# Patient Record
Sex: Male | Born: 1980 | Race: Black or African American | Hispanic: No | Marital: Single | Smoking: Never smoker
Health system: Southern US, Community
[De-identification: ages and names within clinical notes are randomized; demographics above are authoritative.]

## PROBLEM LIST (undated history)

## (undated) DIAGNOSIS — N289 Disorder of kidney and ureter, unspecified: Secondary | ICD-10-CM

## (undated) HISTORY — PX: KIDNEY SURGERY: SHX687

---
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
## 2003-09-21 ENCOUNTER — Emergency Department (HOSPITAL_COMMUNITY): Admission: EM | Admit: 2003-09-21 | Discharge: 2003-09-21 | Payer: Self-pay | Admitting: Emergency Medicine

## 2003-11-17 ENCOUNTER — Emergency Department (HOSPITAL_COMMUNITY): Admission: EM | Admit: 2003-11-17 | Discharge: 2003-11-17 | Payer: Self-pay | Admitting: Emergency Medicine

## 2008-08-25 ENCOUNTER — Emergency Department (HOSPITAL_BASED_OUTPATIENT_CLINIC_OR_DEPARTMENT_OTHER): Admission: EM | Admit: 2008-08-25 | Discharge: 2008-08-25 | Payer: Self-pay | Admitting: Emergency Medicine

## 2010-05-08 LAB — URINALYSIS, ROUTINE W REFLEX MICROSCOPIC
Bilirubin Urine: NEGATIVE
Glucose, UA: NEGATIVE mg/dL
Hgb urine dipstick: NEGATIVE
Ketones, ur: NEGATIVE mg/dL
Nitrite: NEGATIVE
Protein, ur: NEGATIVE mg/dL
Specific Gravity, Urine: 1.016 (ref 1.005–1.030)
Urobilinogen, UA: 1 mg/dL (ref 0.0–1.0)
pH: 7.5 (ref 5.0–8.0)

## 2010-05-08 LAB — URINE CULTURE
Colony Count: NO GROWTH
Culture: NO GROWTH

## 2011-02-24 ENCOUNTER — Other Ambulatory Visit: Payer: Self-pay | Admitting: Oncology

## 2011-09-29 ENCOUNTER — Telehealth: Payer: Self-pay | Admitting: *Deleted

## 2011-09-29 NOTE — Telephone Encounter (Signed)
Opened in error

## 2011-10-03 ENCOUNTER — Telehealth: Payer: Self-pay | Admitting: Oncology

## 2011-10-03 NOTE — Telephone Encounter (Signed)
Chart opened in error

## 2013-03-30 ENCOUNTER — Encounter (HOSPITAL_COMMUNITY): Payer: Self-pay | Admitting: Emergency Medicine

## 2013-03-30 ENCOUNTER — Emergency Department (HOSPITAL_COMMUNITY)
Admission: EM | Admit: 2013-03-30 | Discharge: 2013-03-30 | Disposition: A | Discharge status: Home / Self Care | Payer: Self-pay | Attending: Emergency Medicine | Admitting: Emergency Medicine

## 2013-03-30 DIAGNOSIS — R319 Hematuria, unspecified: Secondary | ICD-10-CM | POA: Insufficient documentation

## 2013-03-30 DIAGNOSIS — N23 Unspecified renal colic: Secondary | ICD-10-CM | POA: Insufficient documentation

## 2013-03-30 DIAGNOSIS — Z87442 Personal history of urinary calculi: Secondary | ICD-10-CM | POA: Insufficient documentation

## 2013-03-30 DIAGNOSIS — Z791 Long term (current) use of non-steroidal anti-inflammatories (NSAID): Secondary | ICD-10-CM | POA: Insufficient documentation

## 2013-03-30 HISTORY — DX: Disorder of kidney and ureter, unspecified: N28.9

## 2013-03-30 LAB — URINALYSIS, ROUTINE W REFLEX MICROSCOPIC
Bilirubin Urine: NEGATIVE
Glucose, UA: NEGATIVE mg/dL
Ketones, ur: NEGATIVE mg/dL
Nitrite: NEGATIVE
Protein, ur: NEGATIVE mg/dL
Specific Gravity, Urine: 1.022 (ref 1.005–1.030)
Urobilinogen, UA: 1 mg/dL (ref 0.0–1.0)
pH: 7 (ref 5.0–8.0)

## 2013-03-30 LAB — BASIC METABOLIC PANEL
BUN: 7 mg/dL (ref 6–23)
CO2: 28 mEq/L (ref 19–32)
Calcium: 9.5 mg/dL (ref 8.4–10.5)
Chloride: 102 mEq/L (ref 96–112)
Creatinine, Ser: 0.88 mg/dL (ref 0.50–1.35)
GFR calc Af Amer: >90 mL/min (ref >90)
GFR calc non Af Amer: >90 mL/min (ref >90)
Glucose, Bld: 86 mg/dL (ref 70–99)
Potassium: 4.2 mEq/L (ref 3.7–5.3)
Sodium: 141 mEq/L (ref 137–147)

## 2013-03-30 LAB — URINE MICROSCOPIC-ADD ON

## 2013-03-30 MED ORDER — KETOROLAC TROMETHAMINE 60 MG/2ML IM SOLN
60.0000 mg | Freq: Once | INTRAMUSCULAR | Status: AC
  Administered 2013-03-30: 60 mg via INTRAMUSCULAR
  Filled 2013-03-30: qty 2

## 2013-03-30 MED ORDER — HYDROCODONE-ACETAMINOPHEN 5-325 MG PO TABS: 1.0000 | ORAL_TABLET | ORAL | Status: DC | PRN

## 2013-03-30 MED ORDER — KETOROLAC TROMETHAMINE 30 MG/ML IJ SOLN: 30.0000 mg | Freq: Once | INTRAMUSCULAR | Status: DC

## 2013-03-30 MED ORDER — HYDROMORPHONE HCL PF 1 MG/ML IJ SOLN: 1.0000 mg | Freq: Once | INTRAMUSCULAR | Status: DC

## 2013-03-30 NOTE — Discharge Instructions (Signed)
Ureteral Colic (Kidney Stones) °Ureteral colic is the result of a condition when kidney stones form inside the kidney. Once kidney stones are formed they may move into the tube that connects the kidney with the bladder (ureter). If this occurs, this condition may cause pain (colic) in the ureter.  °CAUSES  °Pain is caused by stone movement in the ureter and the obstruction caused by the stone. °SYMPTOMS  °The pain comes and goes as the ureter contracts around the stone. The pain is usually intense, sharp, and stabbing in character. The location of the pain may move as the stone moves through the ureter. When the stone is near the kidney the pain is usually located in the back and radiates to the belly (abdomen). When the stone is ready to pass into the bladder the pain is often located in the lower abdomen on the side the stone is located. At this location, the symptoms may mimic those of a urinary tract infection with urinary frequency. Once the stone is located here it often passes into the bladder and the pain disappears completely. °TREATMENT  °· Your caregiver will provide you with medicine for pain relief. °· You may require specialized follow-up X-rays. °· The absence of pain does not always mean that the stone has passed. It may have just stopped moving. If the urine remains completely obstructed, it can cause loss of kidney function or even complete destruction of the involved kidney. It is your responsibility and in your interest that X-rays and follow-ups as suggested by your caregiver are completed. Relief of pain without passage of the stone can be associated with severe damage to the kidney, including loss of kidney function on that side. °· If your stone does not pass on its own, additional measures may be taken by your caregiver to ensure its removal. °HOME CARE INSTRUCTIONS  °· Increase your fluid intake. Water is the preferred fluid since juices containing vitamin C may acidify the urine making it  less likely for certain stones (uric acid stones) to pass. °· Strain all urine. A strainer will be provided. Keep all particulate matter or stones for your caregiver to inspect. °· Take your pain medicine as directed. °· Make a follow-up appointment with your caregiver as directed. °· Remember that the goal is passage of your stone. The absence of pain does not mean the stone is gone. Follow your caregiver's instructions. °· Only take over-the-counter or prescription medicines for pain, discomfort, or fever as directed by your caregiver. °SEEK MEDICAL CARE IF:  °· Pain cannot be controlled with the prescribed medicine. °· You have a fever. °· Pain continues for longer than your caregiver advises it should. °· There is a change in the pain, and you develop chest discomfort or constant abdominal pain. °· You feel faint or pass out. °MAKE SURE YOU:  °· Understand these instructions. °· Will watch your condition. °· Will get help right away if you are not doing well or get worse. °Document Released: 10/26/2004 Document Revised: 05/13/2012 Document Reviewed: 07/13/2010 °[employer]® Patient Information ©2014 [employer]. ° °

## 2013-03-30 NOTE — ED Notes (Signed)
Pt from home c/o L flank pain x3 days. Pt denies dysuria, frequency, N/V. Pt has hx of kidney stones. Pt is A&O and in NAD

## 2013-03-30 NOTE — ED Provider Notes (Signed)
CSN: [id number]     Arrival date & time 03/30/13  1120 History   First MD Initiated Contact with Patient 03/30/13 1132     Chief Complaint  Patient presents with  . Flank Pain      HPI Patient reports a history of ureteral stones.  States over the past several days he's had some left flank pain with radiation towards his left groin.  His pain became more severe this morning and then just prior to arrival of emergency department his pain seemed to significantly improve.  He states his pain is just a dull ache now.  No dysuria or urinary frequency.  He has a history of kidney stones before with CT evidence of stone several years ago in the emergency department.  Films are available.  He denies nausea and vomiting.  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 Renal disorder     renal stones   Past Surgical History  Procedure Laterality Date  . Kidney surgery Left     reconstruction    No family history on file. History  Substance Use Topics  . Smoking status: Never Smoker   . Smokeless tobacco: Not on file  . Alcohol Use: Yes    Review of Systems  All other systems reviewed and are negative.      Allergies  Review of patient's allergies indicates no known allergies.  Home Medications   Current Outpatient Rx  Name  Route  Sig  Dispense  Refill  . ibuprofen (ADVIL,MOTRIN) 200 MG tablet   Oral   Take 200 mg by mouth once.         . naproxen sodium (ANAPROX) 220 MG tablet   Oral   Take 660 mg by mouth once.         Marland Kitchen. HYDROcodone-acetaminophen (NORCO/VICODIN) 5-325 MG per tablet   Oral   Take 1 tablet by mouth every 4 (four) hours as needed for moderate pain.   12 tablet   0    BP 122/97  Pulse 55  Resp 17  SpO2 97% Physical Exam  Nursing note and vitals reviewed. Constitutional: He is oriented to person, place, and time. He appears well-developed and well-nourished.  HENT:   Head: Normocephalic and atraumatic.  Eyes: EOM are normal.  Neck: Normal range of motion.  Cardiovascular: Normal rate, regular rhythm, normal heart sounds and intact distal pulses.   Pulmonary/Chest: Effort normal and breath sounds normal. No respiratory distress.  Abdominal: Soft. He exhibits no distension. There is no tenderness.  Musculoskeletal: Normal range of motion.  Neurological: He is alert and oriented to person, place, and time.  Skin: Skin is warm and dry.  Psychiatric: He has a normal mood and affect. Judgment normal.    ED Course  Procedures (including critical care time) Labs Review Labs Reviewed  URINALYSIS, ROUTINE W REFLEX MICROSCOPIC - Abnormal; Notable for the following:    Hgb urine dipstick LARGE (*)    Leukocytes, UA TRACE (*)    All other components within normal limits  BASIC METABOLIC PANEL  URINE MICROSCOPIC-ADD ON   Imaging Review No results found.   EKG Interpretation None      MDM   Final diagnoses:  Ureteral colic  Hematuria    Patient's pain is significantly improved at this time.  He does have hematuria.  His symptoms sound consistent with ureteral colic.  Given the fact that  his had a known stone before and is pain-free at this time.  No additional workup will be obtained.  His creatinine is normal.  No indication for imaging.  He understands return to ER for new or worsening symptoms.  Outpatient neurology followup.    Lyanne Co, MD 03/30/13 [phone]

## 2013-10-23 ENCOUNTER — Emergency Department (HOSPITAL_BASED_OUTPATIENT_CLINIC_OR_DEPARTMENT_OTHER)
Admission: EM | Admit: 2013-10-23 | Discharge: 2013-10-23 | Disposition: A | Discharge status: Home / Self Care | Payer: Self-pay | Attending: Emergency Medicine | Admitting: Emergency Medicine

## 2013-10-23 ENCOUNTER — Encounter (HOSPITAL_BASED_OUTPATIENT_CLINIC_OR_DEPARTMENT_OTHER): Payer: Self-pay | Admitting: Emergency Medicine

## 2013-10-23 ENCOUNTER — Emergency Department (HOSPITAL_BASED_OUTPATIENT_CLINIC_OR_DEPARTMENT_OTHER): Payer: Self-pay

## 2013-10-23 DIAGNOSIS — Z87448 Personal history of other diseases of urinary system: Secondary | ICD-10-CM | POA: Insufficient documentation

## 2013-10-23 DIAGNOSIS — Y9389 Activity, other specified: Secondary | ICD-10-CM | POA: Insufficient documentation

## 2013-10-23 DIAGNOSIS — S20219A Contusion of unspecified front wall of thorax, initial encounter: Secondary | ICD-10-CM | POA: Insufficient documentation

## 2013-10-23 DIAGNOSIS — X500XXA Overexertion from strenuous movement or load, initial encounter: Secondary | ICD-10-CM | POA: Insufficient documentation

## 2013-10-23 DIAGNOSIS — Z791 Long term (current) use of non-steroidal anti-inflammatories (NSAID): Secondary | ICD-10-CM | POA: Insufficient documentation

## 2013-10-23 DIAGNOSIS — S20212A Contusion of left front wall of thorax, initial encounter: Secondary | ICD-10-CM

## 2013-10-23 DIAGNOSIS — Y9289 Other specified places as the place of occurrence of the external cause: Secondary | ICD-10-CM | POA: Insufficient documentation

## 2013-10-23 DIAGNOSIS — S298XXA Other specified injuries of thorax, initial encounter: Secondary | ICD-10-CM | POA: Insufficient documentation

## 2013-10-23 DIAGNOSIS — Z79899 Other long term (current) drug therapy: Secondary | ICD-10-CM | POA: Insufficient documentation

## 2013-10-23 MED ORDER — OXYCODONE-ACETAMINOPHEN 5-325 MG PO TABS
2.0000 | ORAL_TABLET | Freq: Once | ORAL | Status: AC
  Administered 2013-10-23: 2 via ORAL
  Filled 2013-10-23: qty 2

## 2013-10-23 MED ORDER — OXYCODONE-ACETAMINOPHEN 5-325 MG PO TABS: 1.0000 | ORAL_TABLET | Freq: Four times a day (QID) | ORAL | Status: DC | PRN

## 2013-10-23 NOTE — ED Notes (Signed)
Hit in left ribs playing football on Saturday,  Today did testing for fire dept  Heard left ribs pop

## 2013-10-23 NOTE — ED Provider Notes (Signed)
Medical screening examination/treatment/procedure(s) were performed by non-physician practitioner and as supervising physician I was immediately available for consultation/collaboration.   EKG Interpretation None       Venna Berberich, MD 10/23/13 2339 

## 2013-10-23 NOTE — ED Notes (Signed)
Left rib injury while playing football 5 days ago. Today he felt a pop while pulling a rope in [employer].

## 2013-10-23 NOTE — Discharge Instructions (Signed)
Take percocet for severe pain only. No driving or operating heavy machinery while taking percocet. This medication may cause drowsiness.  Rib Contusion A rib contusion (bruise) can occur by a blow to the chest or by a fall against a hard object. Usually these will be much better in a couple weeks. If X-rays were taken today and there are no broken bones (fractures), the diagnosis of bruising is made. However, broken ribs may not show up for several days, or may be discovered later on a routine X-ray when signs of healing show up. If this happens to you, it does not mean that something was missed on the X-ray, but simply that it did not show up on the first X-rays. Earlier diagnosis will not usually change the treatment. HOME CARE INSTRUCTIONS   Avoid strenuous activity. Be careful during activities and avoid bumping the injured ribs. Activities that pull on the injured ribs and cause pain should be avoided, if possible.  For the first day or two, an ice pack used every 20 minutes while awake may be helpful. Put ice in a plastic bag and put a towel between the bag and the skin.  Eat a normal, well-balanced diet. Drink plenty of fluids to avoid constipation.  Take deep breaths several times a day to keep lungs free of infection. Try to cough several times a day. Splint the injured area with a pillow while coughing to ease pain. Coughing can help prevent pneumonia.  Wear a rib belt or binder only if told to do so by your caregiver. If you are wearing a rib belt or binder, you must do the breathing exercises as directed by your caregiver. If not used properly, rib belts or binders restrict breathing which can lead to pneumonia.  Only take over-the-counter or prescription medicines for pain, discomfort, or fever as directed by your caregiver. SEEK MEDICAL CARE IF:   You or your child has an oral temperature above 102 F (38.9 C).  Your baby is older than 3 months with a rectal temperature of 100.5  F (38.1 C) or higher for more than 1 day.  You develop a cough, with thick or bloody sputum. SEEK IMMEDIATE MEDICAL CARE IF:   You have difficulty breathing.  You feel sick to your stomach (nausea), have vomiting or belly (abdominal) pain.  You have worsening pain, not controlled with medications, or there is a change in the location of the pain.  You develop sweating or radiation of the pain into the arms, jaw or shoulders, or become light headed or faint.  You or your child has an oral temperature above 102 F (38.9 C), not controlled by medicine.  Your or your baby is older than 3 months with a rectal temperature of 102 F (38.9 C) or higher.  Your baby is 72 months old or younger with a rectal temperature of 100.4 F (38 C) or higher. MAKE SURE YOU:   Understand these instructions.  Will watch your condition.  Will get help right away if you are not doing well or get worse. Document Released: 10/11/2000 Document Revised: 05/13/2012 Document Reviewed: 09/04/2007 [facility] Patient Information 2015 [place], [place]. This information is not intended to replace advice given to you by your health care provider. Make sure you discuss any questions you have with your health care provider.

## 2013-10-23 NOTE — ED Provider Notes (Signed)
CSN: [id number]     Arrival date & time 10/23/13  1835 History   First MD Initiated Contact with Patient 10/23/13 2035     Chief Complaint  Patient presents with  . Rib Injury     (Consider location/radiation/quality/duration/timing/severity/associated sxs/prior Treatment) HPI Comments: Pt is a 33 y/o male who presents to the ED complaining of left rib pain x days, worsening today. States 5 days ago he was hit on the left side while playing football, and today while in [employer] stage [employer] training he felt a "pop" while pulling a rope. He believes he broke his rib. He reports pain with movement, deep inspiration, spitting and coughing. Denies shortness of breath. He has not tried any alleviating factors for his symptoms.  The history is provided by the patient.    Past Medical History  Diagnosis Date  . Renal disorder     renal stones   Past Surgical History  Procedure Laterality Date  . Kidney surgery Left     reconstruction    No family history on file. History  Substance Use Topics  . Smoking status: Never Smoker   . Smokeless tobacco: Not on file  . Alcohol Use: Yes    Review of Systems  Musculoskeletal:       + L sided rib pain.  All other systems reviewed and are negative.     Allergies  Review of patient's allergies indicates no known allergies.  Home Medications   Prior to Admission medications   Medication Sig Start Date End Date Taking? Authorizing Provider  HYDROcodone-acetaminophen (NORCO/VICODIN) 5-325 MG per tablet Take 1 tablet by mouth every 4 (four) hours as needed for moderate pain. 03/30/13   Lyanne Co, MD  ibuprofen (ADVIL,MOTRIN) 200 MG tablet Take 200 mg by mouth once.    Historical Provider, MD  naproxen sodium (ANAPROX) 220 MG tablet Take 660 mg by mouth once.    Historical Provider, MD  oxyCODONE-acetaminophen (PERCOCET) 5-325 MG per tablet Take 1-2 tablets by mouth every 6 (six) hours as needed for severe pain. 10/23/13   Trevor Mace, PA-C    BP 104/68  Pulse 54  Temp(Src) 98.9 F (37.2 C) (Oral)  Resp 20  Ht 6' (1.829 m)  Wt 175 lb (79.379 kg)  BMI 23.73 kg/m2  SpO2 99% Physical Exam  Nursing note and vitals reviewed. Constitutional: He is oriented to person, place, and time. He appears well-developed and well-nourished. No distress.  HENT:  Head: Normocephalic and atraumatic.  Mouth/Throat: Oropharynx is clear and moist.  Eyes: Conjunctivae are normal.  Neck: Normal range of motion. Neck supple.  Cardiovascular: Normal rate, regular rhythm and normal heart sounds.   Pulmonary/Chest: Effort normal and breath sounds normal. He has no decreased breath sounds.  TTP left lower antero-lateral ribs. No bruising. No crepitus or step-off.  Abdominal: Soft. Bowel sounds are normal. There is no tenderness.  Musculoskeletal: Normal range of motion. He exhibits no edema.  Neurological: He is alert and oriented to person, place, and time.  Skin: Skin is warm and dry. He is not diaphoretic.  Psychiatric: He has a normal mood and affect. His behavior is normal.    ED Course  Procedures (including critical care time) Labs Review Labs Reviewed - No data to display  Imaging Review Dg Ribs Unilateral W/chest Left  10/23/2013   CLINICAL DATA:  Left anterior lower rib pain for 1 day  EXAM: LEFT RIBS AND CHEST - 3+ VIEW  COMPARISON:  None.  FINDINGS: No fracture  or other bone lesions are seen involving the ribs. There is no evidence of pneumothorax or pleural effusion. Both lungs are clear. Heart size and mediastinal contours are within normal limits.  IMPRESSION: No acute fracture or dislocation of the left ribs.   Electronically Signed   By: Sherian Rein M.D.   On: 10/23/2013 21:26     EKG Interpretation None      MDM   Final diagnoses:  Rib contusion, left, initial encounter   Pt well appearing and in NAD. AFVSS. No bruising or signs of trauma. No respiratory distress. Xray without acute finding. Advised ice, will give  short course of pain medication. Stable for d/c. Return precautions given. Patient states understanding of treatment care plan and is agreeable.   Trevor Mace, PA-C 10/23/13 2151

## 2013-11-14 ENCOUNTER — Other Ambulatory Visit: Payer: Self-pay

## 2015-05-05 IMAGING — CR DG RIBS W/ CHEST 3+V*L*
3 series · 3 of 3 positions shown · non-contrast
Comparison: None.

CLINICAL DATA: Left anterior lower rib pain for 1 day

EXAM:
LEFT RIBS AND CHEST - 3+ VIEW

[w chest pa]
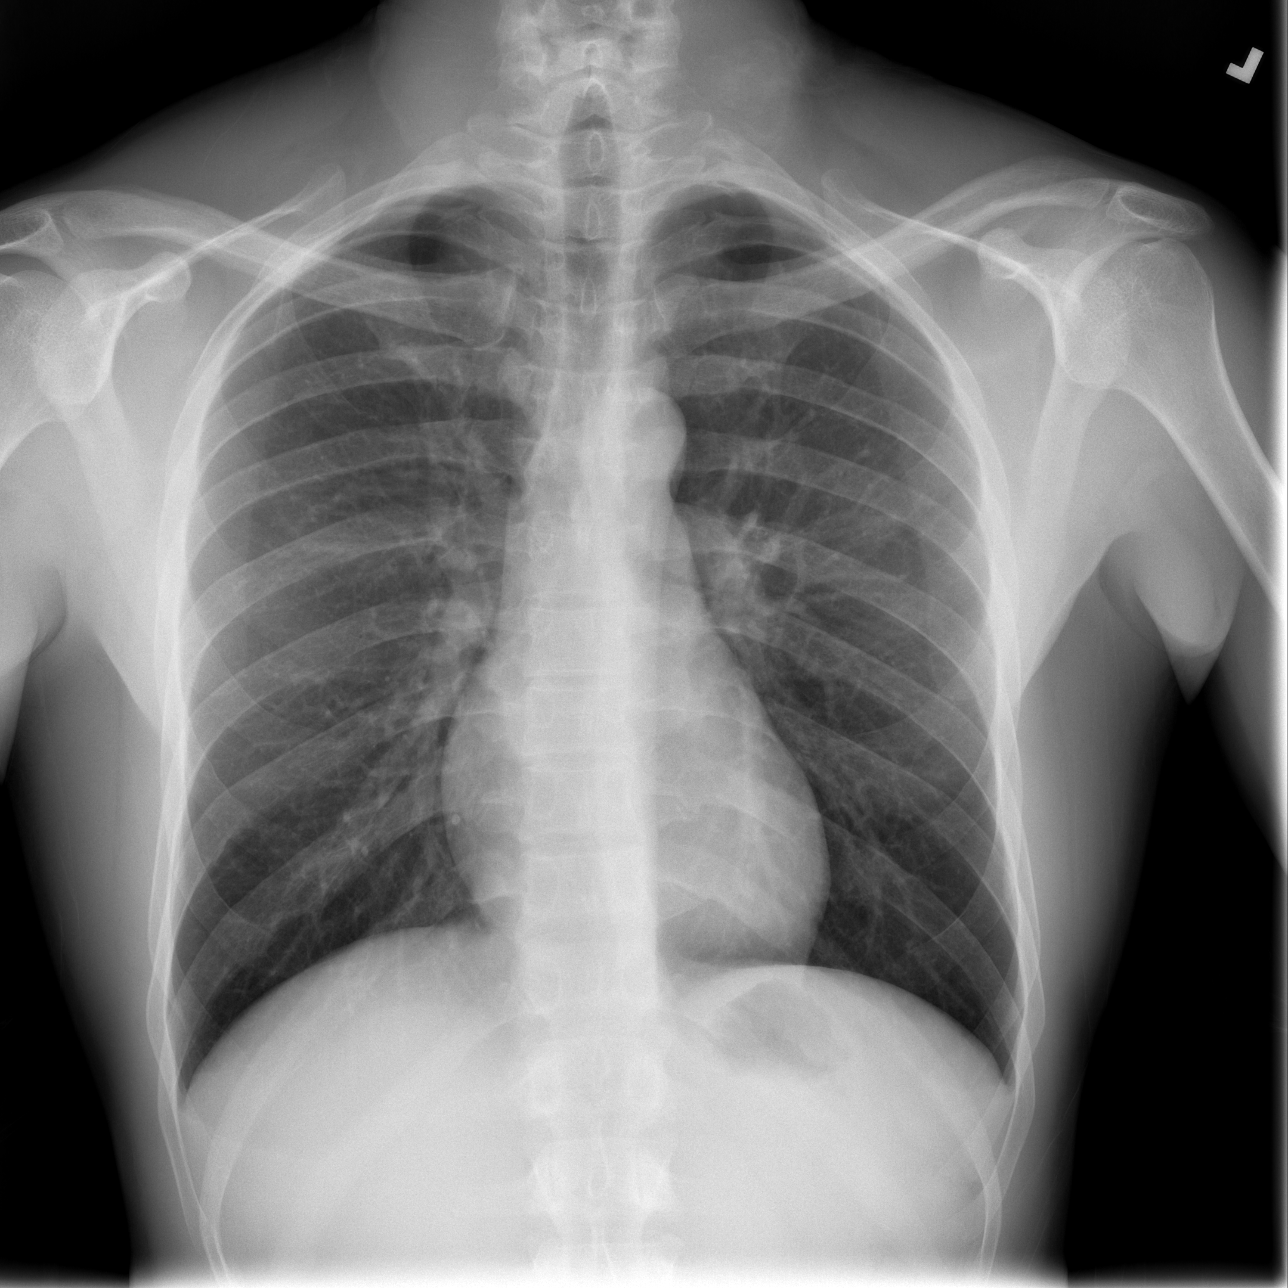

[w ribs ap/pa lower left]
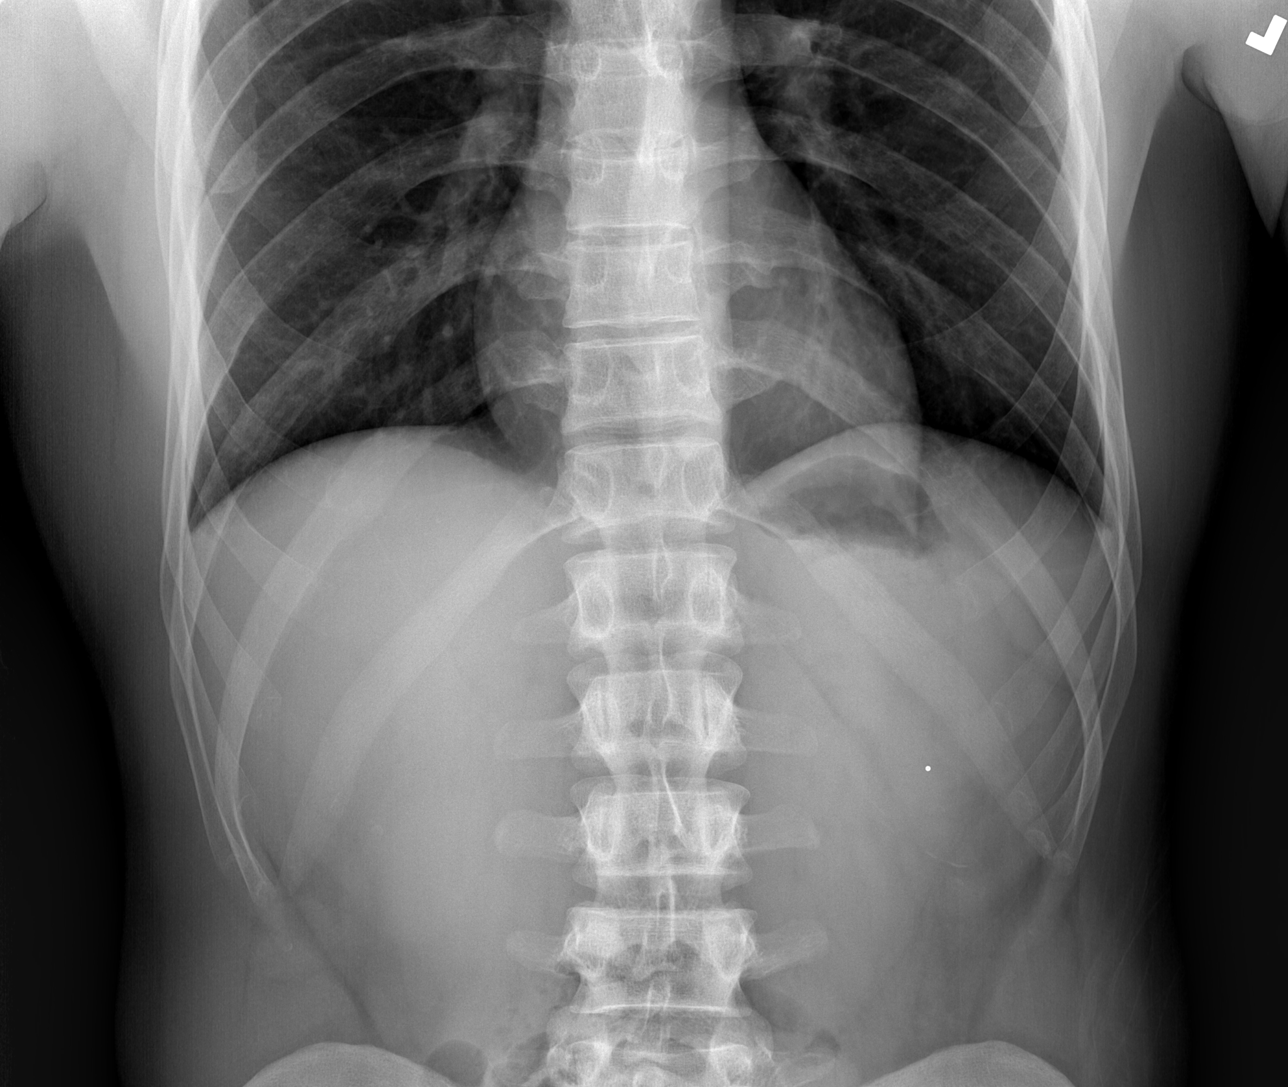

[w ribs oblique left]
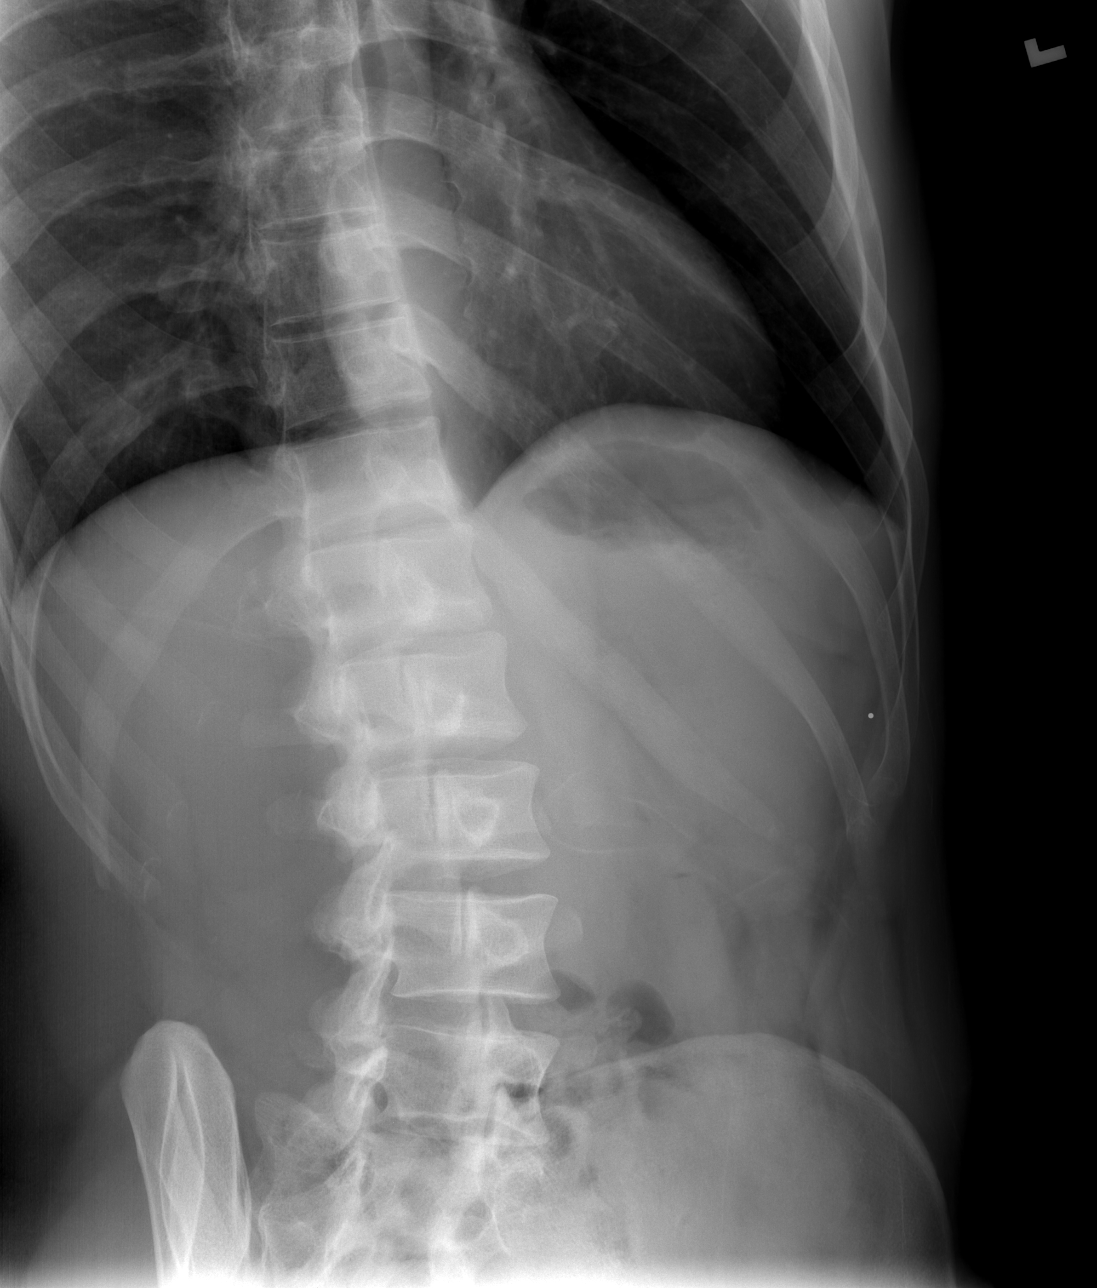

[3 of 3 positions shown; findings below may reference images not displayed]

FINDINGS: No fracture or other bone lesions are seen involving the ribs. There
is no evidence of pneumothorax or pleural effusion. Both lungs are
clear. Heart size and mediastinal contours are within normal limits.
IMPRESSION: No acute fracture or dislocation of the left ribs.

## 2017-06-05 ENCOUNTER — Other Ambulatory Visit: Payer: Self-pay

## 2017-06-05 ENCOUNTER — Encounter (HOSPITAL_BASED_OUTPATIENT_CLINIC_OR_DEPARTMENT_OTHER): Payer: Self-pay | Admitting: *Deleted

## 2017-06-05 ENCOUNTER — Emergency Department (HOSPITAL_BASED_OUTPATIENT_CLINIC_OR_DEPARTMENT_OTHER)
Admission: EM | Admit: 2017-06-05 | Discharge: 2017-06-06 | Disposition: A | Discharge status: Home / Self Care | Payer: BLUE CROSS/BLUE SHIELD | Attending: Emergency Medicine | Admitting: Emergency Medicine

## 2017-06-05 DIAGNOSIS — R109 Unspecified abdominal pain: Secondary | ICD-10-CM

## 2017-06-05 DIAGNOSIS — R1084 Generalized abdominal pain: Secondary | ICD-10-CM | POA: Insufficient documentation

## 2017-06-05 DIAGNOSIS — M549 Dorsalgia, unspecified: Secondary | ICD-10-CM | POA: Diagnosis present

## 2017-06-05 LAB — URINALYSIS, ROUTINE W REFLEX MICROSCOPIC
Bilirubin Urine: NEGATIVE
Glucose, UA: NEGATIVE mg/dL
Hgb urine dipstick: NEGATIVE
Ketones, ur: NEGATIVE mg/dL
Leukocytes, UA: NEGATIVE
Nitrite: NEGATIVE
Protein, ur: NEGATIVE mg/dL
Specific Gravity, Urine: 1.025 (ref 1.005–1.030)
pH: 7 (ref 5.0–8.0)

## 2017-06-05 NOTE — ED Notes (Signed)
Alert, NAD, calm, interactive, resps e/u, speaking in clear complete sentences, no dyspnea noted, skin W&D, initial VSS,  C/o L hip pain, also L lower back, LLQ and L groin, comes and goes, not affected by movement, but is affected by certain positions, (denies:sob, NVD, fever, urinary sx, bowel sx, dizziness or visual changes). Seen by PCP last week for upper abd/ulcer pain, relieved by ranitidine, also seen by PCP similar back pain as tonight not relieved by flexeril. UA w/o hematuria tonight. "feels different than past kidney stones. Reports decreased back extension.

## 2017-06-05 NOTE — ED Triage Notes (Signed)
Back pain into his left flank. Pain started 1.5 weeks ago.

## 2017-06-06 MED ORDER — HYDROCODONE-ACETAMINOPHEN 5-325 MG PO TABS: 1.0000 | ORAL_TABLET | Freq: Four times a day (QID) | ORAL | 0 refills | Status: DC | PRN

## 2017-06-06 NOTE — ED Provider Notes (Signed)
[facility] EMERGENCY DEPARTMENT Provider Note   CSN: [id number] Arrival date & time: 06/05/17  1941     History   Chief Complaint Chief Complaint  Patient presents with  . Back Pain    HPI Ralph Meyer is a 37 y.o. male.  The history is provided by the patient.  Back Pain   This is a new problem. The current episode started more than 1 week ago. The problem occurs daily. The problem has not changed since onset.The pain is associated with no known injury. The pain is moderate. The symptoms are aggravated by certain positions. Associated symptoms include abdominal pain. Pertinent negatives include no chest pain, no fever, no numbness, no bladder incontinence, no dysuria, no paresis and no weakness. Treatments tried: Flexeril. The treatment provided no relief.  Patient presents with left flank pain.  He reports is been intermittent for over a week.  He reports it seems to be worse at times with standing up.  He reports at times when he stands he also has pain in his left lower quadrant. No known trauma.  Does not appear to be caused by his work.  Seen by his PCP and given Flexeril Reports history of kidney stones, but this was different. No new leg weakness.  No urinary incontinence. 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  No issues since that  Past Medical History:  Diagnosis Date  . Renal disorder    renal stones    There are no active problems to display for this patient.   Past Surgical History:  Procedure Laterality Date  . KIDNEY SURGERY Left    reconstruction         Home Medications    Prior to Admission medications   Medication Sig Start Date End Date Taking? Authorizing Provider  HYDROcodone-acetaminophen (NORCO/VICODIN) 5-325 MG tablet Take 1 tablet by mouth every 6 (six) hours as needed for severe pain. 06/06/17   Zadie Rhine, MD    Family History No family history on file.  Social History Social History    Tobacco Use  . Smoking status: Never Smoker  . Smokeless tobacco: Never Used  Substance Use Topics  . Alcohol use: Yes  . Drug use: No     Allergies   Patient has no known allergies.   Review of Systems Review of Systems  Constitutional: Negative for fever and unexpected weight change.       Denies night sweats  Respiratory: Negative for shortness of breath.   Cardiovascular: Negative for chest pain.  Gastrointestinal: Positive for abdominal pain. Negative for blood in stool and vomiting.  Genitourinary: Positive for flank pain. Negative for bladder incontinence, difficulty urinating, dysuria, frequency, hematuria, testicular pain and urgency.  Musculoskeletal: Positive for back pain.  Neurological: Negative for dizziness, weakness and numbness.  All other systems reviewed and are negative.    Physical Exam Updated Vital Signs BP 138/88 (BP Location: Left Arm)   Pulse 62   Temp 98.5 F (36.9 C) (Oral)   Resp 18   Ht 1.829 m (6')   Wt 79.4 kg (175 lb)   SpO2 100%   BMI 23.73 kg/m   Physical Exam CONSTITUTIONAL: Well developed/well nourished HEAD: Normocephalic/atraumatic EYES: EOMI/PERRL ENMT: Mucous membranes moist NECK: supple no meningeal signs SPINE/BACK:entire spine nontender  CV: S1/S2 noted, no murmurs/rubs/gallops noted LUNGS: Lungs are clear to auscultation bilaterally, no apparent distress ABDOMEN: soft, nontender, no rebound or guarding Mild tenderness to left lower quadrant upon standing.  This resolves when sitting.  No hernias noted GU:no cva tenderness, scar noted to left flank.  No erythema or rash NEURO: Awake/alert, equal motor 5/5 strength noted with the following: hip flexion/knee flexion/extension, foot dorsi/plantar flexion  Pt is able to ambulate unassisted. EXTREMITIES: pulses normal, full ROM Full range of motion of both hips, without any tenderness elicited SKIN: warm, color normal PSYCH: no abnormalities of mood noted, alert and  oriented to situation    ED Treatments / Results  Labs (all labs ordered are listed, but only abnormal results are displayed) Labs Reviewed  URINALYSIS, ROUTINE W REFLEX MICROSCOPIC    EKG None  Radiology No results found.  Procedures Procedures (including critical care time)  Medications Ordered in ED Medications - No data to display   Initial Impression / Assessment and Plan / ED Course  I have reviewed the triage vital signs and the nursing notes.  Pertinent labs  results that were available during my care of the patient were reviewed by me and considered in my medical decision making (see chart for details). Narcotic database reviewed and considered in decision making    Patient presents with left flank pain for 1.5 weeks.  When he is at rest it is not bothering.  He has minimal tenderness upon standing his left lower quadrant. Urinalysis negative.  He is afebrile.  He is not septic appearing We had long discussion about potential causes.  I do feel at some point he would benefit from definitive imaging either CT scans or ultrasound renal Advised him we could perform CT imaging tonight, but after discussion he prefers to go home.  I feel this is appropriate as he is overall well-appearing and has a PCP for follow-up with This could be related to distant history of reconstruction of his kidney, or more likely just a muscle strain.  I discussed this with patient extensively  Final Clinical Impressions(s) / ED Diagnoses   Final diagnoses:  Left flank pain    ED Discharge Orders        Ordered    HYDROcodone-acetaminophen (NORCO/VICODIN) 5-325 MG tablet  Every 6 hours PRN     06/06/17 0011       Zadie Rhine, MD 06/06/17 0018

## 2017-06-06 NOTE — Discharge Instructions (Addendum)
As we discussed, follow-up with your primary doctor soon.  You may need to have ultrasounds of your kidneys done as an outpatient.  SEEK IMMEDIATE MEDICAL ATTENTION IF: New numbness, tingling, weakness, or problem with the use of your arms or legs.  Severe back pain not relieved with medications.  Fever over 100 Change in bowel or bladder control (if you lose control of stool or urine, or if you are unable to urinate) Increasing pain in any areas of the body (such as chest or abdominal pain).  Shortness of breath, dizziness or fainting.  Nausea (feeling sick to your stomach), vomiting, fever, or sweats.

## 2017-07-06 ENCOUNTER — Encounter (HOSPITAL_COMMUNITY): Payer: Self-pay

## 2017-07-06 ENCOUNTER — Emergency Department (HOSPITAL_COMMUNITY)
Admission: EM | Admit: 2017-07-06 | Discharge: 2017-07-06 | Disposition: A | Discharge status: Home / Self Care | Payer: BLUE CROSS/BLUE SHIELD | Attending: Emergency Medicine | Admitting: Emergency Medicine

## 2017-07-06 ENCOUNTER — Other Ambulatory Visit: Payer: Self-pay

## 2017-07-06 DIAGNOSIS — M62830 Muscle spasm of back: Secondary | ICD-10-CM | POA: Diagnosis not present

## 2017-07-06 DIAGNOSIS — Y999 Unspecified external cause status: Secondary | ICD-10-CM | POA: Diagnosis not present

## 2017-07-06 DIAGNOSIS — S39012A Strain of muscle, fascia and tendon of lower back, initial encounter: Secondary | ICD-10-CM

## 2017-07-06 DIAGNOSIS — Y929 Unspecified place or not applicable: Secondary | ICD-10-CM | POA: Insufficient documentation

## 2017-07-06 DIAGNOSIS — Y939 Activity, unspecified: Secondary | ICD-10-CM | POA: Diagnosis not present

## 2017-07-06 DIAGNOSIS — X509XXA Other and unspecified overexertion or strenuous movements or postures, initial encounter: Secondary | ICD-10-CM | POA: Insufficient documentation

## 2017-07-06 DIAGNOSIS — M5442 Lumbago with sciatica, left side: Secondary | ICD-10-CM | POA: Diagnosis not present

## 2017-07-06 DIAGNOSIS — S3992XA Unspecified injury of lower back, initial encounter: Secondary | ICD-10-CM | POA: Diagnosis present

## 2017-07-06 MED ORDER — METHOCARBAMOL 500 MG PO TABS: 500.0000 mg | ORAL_TABLET | Freq: Three times a day (TID) | ORAL | 0 refills | Status: DC | PRN

## 2017-07-06 MED ORDER — PREDNISONE 20 MG PO TABS: ORAL_TABLET | ORAL | 0 refills | Status: DC

## 2017-07-06 MED ORDER — NAPROXEN 500 MG PO TABS: 500.0000 mg | ORAL_TABLET | Freq: Two times a day (BID) | ORAL | 0 refills | Status: DC | PRN

## 2017-07-06 NOTE — ED Provider Notes (Signed)
[facility]-EMERGENCY DEPT Provider Note   CSN: [id number] Arrival date & time: 07/06/17  1825     History   Chief Complaint Chief Complaint  Patient presents with  . Back Pain    HPI Ralph Meyer is a 37 y.o. male with a PMHx of kidney stones and PSHx of "left kidney reconstruction", who presents to the ED with complaints of left lower back pain that began after he did some heavy lifting about 6 days ago while helping his wife move.  He describes the pain as 8/10 constant throbbing left lower back pain with a tingling shocking sensation that radiates down the left posterior leg, worse with sitting, somewhat improved with Epson salt soaks, and unrelieved with Flexeril, Tylenol, and Advil.  He reports associated tingling down the posterior left leg.  He denies fevers, chills, CP, SOB, abd pain, N/V/D/C, hematuria, dysuria, incontinence of urine/stool, saddle anesthesia/cauda equina symptoms, focal arthralgias, numbness, focal weakness, or any other complaints at this time.  He denies hx of CA or IVDU.   The history is provided by the patient and medical records. No language interpreter was used.  Back Pain   Pertinent negatives include no chest pain, no fever, no numbness, no abdominal pain, no dysuria and no weakness.    Past Medical History:  Diagnosis Date  . Renal disorder    renal stones    There are no active problems to display for this patient.   Past Surgical History:  Procedure Laterality Date  . KIDNEY SURGERY Left    reconstruction         Home Medications    Prior to Admission medications   Medication Sig Start Date End Date Taking? Authorizing Provider  HYDROcodone-acetaminophen (NORCO/VICODIN) 5-325 MG tablet Take 1 tablet by mouth every 6 (six) hours as needed for severe pain. 06/06/17   Zadie RhineWickline, Donald, MD    Family History History reviewed. No pertinent family history.  Social History Social History   Tobacco Use  . Smoking  status: Never Smoker  . Smokeless tobacco: Never Used  Substance Use Topics  . Alcohol use: Not Currently    Comment: QUIT 2 MONTHS AGO  . Drug use: No     Allergies   Patient has no known allergies.   Review of Systems Review of Systems  Constitutional: Negative for chills and fever.  Respiratory: Negative for shortness of breath.   Cardiovascular: Negative for chest pain.  Gastrointestinal: Negative for abdominal pain, constipation, diarrhea, nausea and vomiting.  Genitourinary: Negative for difficulty urinating (no incontinence), dysuria and hematuria.  Musculoskeletal: Positive for back pain and myalgias. Negative for arthralgias.  Skin: Negative for color change.  Allergic/Immunologic: Negative for immunocompromised state.  Neurological: Negative for weakness and numbness.  Psychiatric/Behavioral: Negative for confusion.   All other systems reviewed and are negative for acute change except as noted in the HPI.    Physical Exam Updated Vital Signs BP (!) 142/93 (BP Location: Right Arm)   Pulse 60   Temp 97.8 F (36.6 C) (Oral)   Resp 15   Ht 6' (1.829 m)   Wt 78 kg (172 lb)   SpO2 100%   BMI 23.33 kg/m   Physical Exam  Constitutional: He is oriented to person, place, and time. Vital signs are normal. He appears well-developed and well-nourished.  Non-toxic appearance. No distress.  Afebrile, nontoxic, NAD  HENT:  Head: Normocephalic and atraumatic.  Mouth/Throat: Mucous membranes are normal.  Eyes: Conjunctivae and EOM are normal.  Right eye exhibits no discharge. Left eye exhibits no discharge.  Neck: Normal range of motion. Neck supple.  Cardiovascular: Normal rate and intact distal pulses.  Pulmonary/Chest: Effort normal. No respiratory distress.  Abdominal: Normal appearance. He exhibits no distension.  Musculoskeletal: Normal range of motion.       Lumbar back: He exhibits tenderness and spasm. He exhibits normal range of motion, no bony tenderness and no  deformity.  Lumbar spine with FROM intact without spinous process TTP, no bony stepoffs or deformities, with mild L sided paraspinous muscle TTP and palpable muscle spasms. Strength and sensation grossly intact in all extremities, +SLR on L side but negative on R side, gait steady and nonantalgic. No overlying skin changes. Distal pulses intact.   Neurological: He is alert and oriented to person, place, and time. He has normal strength. No sensory deficit.  Skin: Skin is warm, dry and intact. No rash noted.  Psychiatric: He has a normal mood and affect.  Nursing note and vitals reviewed.    ED Treatments / Results  Labs (all labs ordered are listed, but only abnormal results are displayed) Labs Reviewed - No data to display  EKG None  Radiology No results found.  Procedures Procedures (including critical care time)  Medications Ordered in ED Medications - No data to display   Initial Impression / Assessment and Plan / ED Course  I have reviewed the triage vital signs and the nursing notes.  Pertinent labs & imaging results that were available during my care of the patient were reviewed by me and considered in my medical decision making (see chart for details).     37 y.o. male here with c/o back pain after doing heavy lifting. On exam, mild L paraspinous muscle TTP, +SLR on L side; no midline spinal tenderness. No red flag s/s of low back pain. No s/s of central cord compression or cauda equina. Lower extremities are neurovascularly intact and patient is ambulating without difficulty. No urinary complaints. Doubt need for imaging/labs, likely muscular strain and sciatica. Doubt kidney stone or kidney issue, given mechanism of injury/onset of symptoms after heavy lifting.  Patient was counseled on back pain precautions and told to do activity as tolerated but do not lift, push, or pull heavy objects more than 10 pounds for the next week. Patient counseled to use ice or heat on back  for no longer than 20 minutes every hour.   Rx given for muscle relaxer and counseled on proper use of muscle relaxant medication. Urged patient not to drink alcohol, drive, or perform any other activities that requires focus while taking these medications. Rx for naprosyn given. Advised tylenol use as well. Will rx prednisone to help with sciatica symptoms.  Patient urged to follow-up with PCP in 1-2wks for recheck of symptoms, or sooner if pain does not improve with treatment and rest or if pain becomes recurrent. Urged to return with worsening severe pain, loss of bowel or bladder control, trouble walking, or other worsening of symptoms; explicit return precautions given. The patient verbalizes understanding and agrees with the plan, I have answered their questions. Discharge instructions concerning home care and prescriptions have been given. The patient is STABLE and is discharged to home in good condition.     Final Clinical Impressions(s) / ED Diagnoses   Final diagnoses:  Acute left-sided low back pain with left-sided sciatica  Muscle spasm of back  Strain of lumbar region, initial encounter    ED Discharge Orders  Ordered    methocarbamol (ROBAXIN) 500 MG tablet  Every 8 hours PRN     07/06/17 2049    naproxen (NAPROSYN) 500 MG tablet  2 times daily PRN     07/06/17 2049    predniSONE (DELTASONE) 20 MG tablet     07/06/17 [address], [place], [place] 07/06/17 2054    Melene Plan, DO 07/06/17 2346

## 2017-07-06 NOTE — Discharge Instructions (Signed)
Back Pain: Your back pain should be treated with medicines such as ibuprofen or aleve and this back pain should get better over the next 2 weeks.  However if you develop severe or worsening pain, low back pain with fever, numbness, weakness or inability to walk or urinate, you should return to the ER immediately.  Please follow up with your doctor this week for a recheck if still having symptoms.  Avoid heavy lifting over 10 pounds over the next two weeks.  Low back pain is discomfort in the lower back that may be due to injuries to muscles and ligaments around the spine.  Occasionally, it may be caused by a a problem to a part of the spine called a disc.  The pain may last several days or a week;  However, most patients get completely well in 4 weeks.  Self - care:  The application of heat can help soothe the pain.  Maintaining your daily activities, including walking, is encourged, as it will help you get better faster than just staying in bed. Perform gentle stretching as discussed. Drink plenty of fluids.  Medications are also useful to help with pain control.  A commonly prescribed medication includes over the counter Tylenol; take as directed on the bottle.   Non steroidal anti inflammatory medications including Ibuprofen and naproxen;  These medications help both pain and swelling and are very useful in treating back pain.  They should be taken with food, as they can cause stomach upset, and more seriously, stomach bleeding.    Muscle relaxants:  These medications can help with muscle tightness that is a cause of lower back pain.  Most of these medications can cause drowsiness, and it is not safe to drive or use dangerous machinery while taking them.  Prednisone: this is a steroid medication which can help with tingling sensations in the legs. Take as directed, usually best to take in the morning with breakfast.   SEEK IMMEDIATE MEDICAL ATTENTION IF: New numbness, tingling, weakness, or  problem with the use of your arms or legs.  Severe back pain not relieved with medications.  Difficulty with or loss of control of your bowel or bladder control.  Increasing pain in any areas of the body (such as chest or abdominal pain).  Shortness of breath, dizziness or fainting.  Nausea (feeling sick to your stomach), vomiting, fever, or sweats.  You will need to follow up with  a primary healthcare provider in 1-2 weeks for reassessment.

## 2017-07-06 NOTE — ED Triage Notes (Addendum)
PT C/O LOWER BACK PAIN SINCE LAST SATURDAY. PT STS HE WAS HELPING HIS WIFE MOVE. TODAY, TINGLING STARTED TO THE LEFT LEG. DENIES PROBLEMS WITH URINATION OR DEFECATION.

## 2017-07-06 NOTE — ED Notes (Signed)
Discharge instructions reviewed with patient. Patient verbalizes understanding. VSS.   

## 2018-12-12 ENCOUNTER — Emergency Department (HOSPITAL_BASED_OUTPATIENT_CLINIC_OR_DEPARTMENT_OTHER)
Admission: EM | Admit: 2018-12-12 | Discharge: 2018-12-12 | Disposition: A | Discharge status: Home / Self Care | Payer: Managed Care, Other (non HMO) | Attending: Emergency Medicine | Admitting: Emergency Medicine

## 2018-12-12 ENCOUNTER — Other Ambulatory Visit: Payer: Self-pay

## 2018-12-12 ENCOUNTER — Encounter (HOSPITAL_BASED_OUTPATIENT_CLINIC_OR_DEPARTMENT_OTHER): Payer: Self-pay

## 2018-12-12 DIAGNOSIS — M5432 Sciatica, left side: Secondary | ICD-10-CM | POA: Insufficient documentation

## 2018-12-12 DIAGNOSIS — M545 Low back pain: Secondary | ICD-10-CM | POA: Diagnosis present

## 2018-12-12 MED ORDER — NAPROXEN 500 MG PO TABS: 500.0000 mg | ORAL_TABLET | Freq: Two times a day (BID) | ORAL | 0 refills | Status: AC

## 2018-12-12 MED ORDER — METHOCARBAMOL 500 MG PO TABS: 500.0000 mg | ORAL_TABLET | Freq: Two times a day (BID) | ORAL | 0 refills | Status: AC

## 2018-12-12 MED ORDER — PREDNISONE 10 MG PO TABS: 40.0000 mg | ORAL_TABLET | Freq: Every day | ORAL | 0 refills | Status: AC

## 2018-12-12 MED FILL — predniSONE 10 MG TABS: 10 | 5 days supply | Qty: 20 | Fill #0

## 2018-12-12 MED FILL — METHOCARBAMOL 500 MG TABLET: 500 | 7 days supply | Qty: 14 | Fill #0

## 2018-12-12 MED FILL — NAPROXEN 500 MG TABS: 500 | 7 days supply | Qty: 14 | Fill #0

## 2018-12-12 NOTE — ED Triage Notes (Signed)
Pt c/o left leg pain X2-3 weeks, unsure of when it started reports pain is increased with sitting. Points to pain starting in left hip with radiation into left leg. Reports taking ibuprofen and tylenol last dose of tylenol at 8 pm last night.

## 2018-12-12 NOTE — Discharge Instructions (Signed)
You have been diagnosed today with left-sided sciatica.  At this time there does not appear to be the presence of an emergent medical condition, however there is always the potential for conditions to change. Please read and follow the below instructions.  Please return to the Emergency Department immediately for any new or worsening symptoms. Please be sure to follow up with your Primary Care Provider within one week regarding your visit today; please call their office to schedule an appointment even if you are feeling better for a follow-up visit. You may use the muscle relaxer Robaxin as prescribed to help with your symptoms.  Do not drive or operate heavy machinery while taking Robaxin as it will make you drowsy.  Do not drink alcohol or take other sedating medications while taking Robaxin as this will worsen side effects. You have been prescribed an NSAID-containing medication called Naproxen today.  Do not take the medications including ibuprofen, Aleve, Advil or other NSAID-containing medications with naproxen.  Please be sure to  take medicine with food and drink plenty of water. You have been prescribed a steroid medication today called prednisone, take only as prescribed over the next 5 days.  Get help right away if: You cannot control when you pee (urinate) or poop (have a bowel movement). You have weakness in any of these areas and it gets worse: Lower back. The area between your hip bones. Butt. Legs. You have redness or swelling of your back. You have a burning feeling when you pee. You have any new/concerning or worsening symptoms.  Please read the additional information packets attached to your discharge summary.  Do not take your medicine if  develop an itchy rash, swelling in your mouth or lips, or difficulty breathing; call 911 and seek immediate emergency medical attention if this occurs.  Note: Portions of this text may have been transcribed using voice recognition  software. Every effort was made to ensure accuracy; however, inadvertent computerized transcription errors may still be present.

## 2018-12-12 NOTE — ED Provider Notes (Addendum)
[facility] EMERGENCY DEPARTMENT Provider Note   CSN: [id number] Arrival date & time: 12/12/18  0915     History   Chief Complaint Chief Complaint  Patient presents with  . Leg Pain    HPI Ralph Meyer is a 38 y.o. male reports history of sciatica, kidney stones, kidney reconstruction "enlarged kidney revision", otherwise healthy.  Reports 2 and half weeks of left leg pain describes pain as a typical sciatica flareup for him pain shoots down the back of his left thigh and around the outside of his left leg, shooting ache intermittent moderate intensity worsened with sitting improved with standing and ambulation has been taking anti-inflammatories without relief.  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  Reports similar instance last year at which time he was diagnosed with acute left-sided lower back pain with left-sided sciatica.  Denies fever/chills, headache, neck pain, chest pain/shortness breath, abdominal pain, back pain, saddle area paresthesias, bowel/bladder incontinence, urinary retention, IV drug use, dysuria, fall/injury, numbness/weakness, tingling, swelling, color change or any additional concerns.     HPI  Past Medical History:  Diagnosis Date  . Renal disorder    renal stones    There are no active problems to display for this patient.   Past Surgical History:  Procedure Laterality Date  . KIDNEY SURGERY Left    reconstruction         Home Medications    Prior to Admission medications   Medication Sig Start Date End Date Taking? Authorizing Provider  methocarbamol (ROBAXIN) 500 MG tablet Take 1 tablet (500 mg total) by mouth 2 (two) times daily for 7 days. 12/12/18 12/19/18  Harlene SaltsMorelli, Nicollette Wilhelmi A, PA-C  naproxen (NAPROSYN) 500 MG tablet Take 1 tablet (500 mg total) by mouth 2 (two) times daily for 7 days. 12/12/18 12/19/18  Harlene SaltsMorelli, Terrell Shimko A, PA-C  predniSONE (DELTASONE) 10  MG tablet Take 4 tablets (40 mg total) by mouth daily for 5 days. 12/12/18 12/17/18  Bill SalinasMorelli, Quinisha Mould A, PA-C    Family History History reviewed. No pertinent family history.  Social History Social History   Tobacco Use  . Smoking status: Never Smoker  . Smokeless tobacco: Never Used  Substance Use Topics  . Alcohol use: Yes  . Drug use: Yes    Types: Marijuana     Allergies   Patient has no known allergies.   Review of Systems Review of Systems Ten systems are reviewed and are negative for acute change except as noted in the HPI   Physical Exam Updated Vital Signs BP 132/71 (BP Location: Right Arm)   Pulse (!) 57   Temp 98.4 F (36.9 C) (Oral)   Resp 16   Ht 6' (1.829 m)   Wt 79.4 kg   SpO2 100%   BMI 23.73 kg/m   Physical Exam Constitutional:      General: He is not in acute distress.    Appearance: Normal appearance. He is not ill-appearing or diaphoretic.  HENT:     Head: Normocephalic and atraumatic. No raccoon eyes or Battle's sign.     Jaw: There is normal jaw occlusion.     Right Ear: External ear normal.     Left Ear: External ear normal.     Nose: Nose normal.  Eyes:     General: Vision grossly intact. Gaze aligned appropriately.     Extraocular Movements: Extraocular movements intact.     Conjunctiva/sclera: Conjunctivae  normal.     Pupils: Pupils are equal, round, and reactive to light.  Neck:     Musculoskeletal: Normal range of motion and neck supple. No neck rigidity.     Trachea: Trachea and phonation normal. No tracheal tenderness or tracheal deviation.  Cardiovascular:     Rate and Rhythm: Normal rate and regular rhythm.     Pulses:          Radial pulses are 2+ on the right side and 2+ on the left side.       Dorsalis pedis pulses are 2+ on the right side and 2+ on the left side.  Pulmonary:     Effort: Pulmonary effort is normal. No respiratory distress.     Breath sounds: Normal air entry. No rhonchi.  Abdominal:     General:  There is no distension.     Palpations: Abdomen is soft. There is no pulsatile mass.     Tenderness: There is no abdominal tenderness. There is no guarding or rebound.  Musculoskeletal:     Comments: No midline C/T/L spinal tenderness to palpation no deformity, crepitus, or step-off noted. No sign of injury to the neck or back.  Reproducible symptoms with palpation of the left gluteal musculature.  Positive left straight leg raise.   Feet:     Right foot:     Protective Sensation: 3 sites tested. 3 sites sensed.     Left foot:     Protective Sensation: 3 sites tested. 3 sites sensed.  Skin:    General: Skin is warm and dry.     Capillary Refill: Capillary refill takes less than 2 seconds.  Neurological:     Mental Status: He is alert.     GCS: GCS eye subscore is 4. GCS verbal subscore is 5. GCS motor subscore is 6.     Comments: Speech is clear and goal oriented, follows commands Major Cranial nerves without deficit, no facial droop Normal strength in upper and lower extremities bilaterally including dorsiflexion and plantar flexion, strong and equal grip strength Sensation normal to light and sharp touch Moves extremities without ataxia, coordination intact Normal gait DTR 2+ bilateral patella, no clonus of the feet  Psychiatric:        Behavior: Behavior is cooperative.    ED Treatments / Results  Labs (all labs ordered are listed, but only abnormal results are displayed) Labs Reviewed - No data to display  EKG None  Radiology No results found.  Procedures Procedures (including critical care time)  Medications Ordered in ED Medications - No data to display   Initial Impression / Assessment and Plan / ED Course  I have reviewed the triage vital signs and the nursing notes.  Pertinent labs & imaging results that were available during my care of the patient were reviewed by me and considered in my medical decision making (see chart for details).    Ralph Meyer  is a 38 y.o. male presenting with left gluteal and leg pain.  Pain present for 2 and half weeks describes it as his typical left-sided sciatica. Patient denies history of trauma, fever, IV drug use, night sweats, weight loss, cancer, saddle anesthesia, urinary rentention, bowel/bladder incontinence. No neurological deficits and normal neuro exam. Suspect musculoskeletal etiology of patient's pain. Pain is consistently reproducible with palpation of the left gluteal musculature and straight leg raise. Abdomen soft/nontender and without pulsatile mass. Patient with equal pedal pulses.  No evidence for spinal epidural abscess, cauda equina, AAA or  other emergent pathologies.  Additionally no evidence of DVT, compartment syndrome or infection.  Imaging not indicated at this time. Patient is ambulatory in the emergency department without assistance. RICE protocol and pain medicine indicated and discussed with patient.  Naproxen 500mg  BID prescribed. Patient denies history of CKD or gastric ulcers/bleeding. Robaxin 500mg  BID prescribed. Patient informed to avoid driving or operating heavy machinery while taking muscle relaxer.  Prednisone 40 mg x 5 days prescribed, patient denies history of diabetes or adverse reaction to steroid medications.  Of note patient reports he was given similar regimen in June 2019 and reports it worked very well for him.  At this time there does not appear to be any evidence of an acute emergency medical condition and the patient appears stable for discharge with appropriate outpatient follow up. Diagnosis was discussed with patient who verbalizes understanding of care plan and is agreeable to discharge. I have discussed return precautions with patient who verbalizes understanding of return precautions. Patient encouraged to follow-up with their PCP. All questions answered. Patient has been discharged in good condition.   Note: Portions of this report may have been transcribed using voice  recognition software. Every effort was made to ensure accuracy; however, inadvertent computerized transcription errors may still be present. Final Clinical Impressions(s) / ED Diagnoses   Final diagnoses:  Left sciatic nerve pain    ED Discharge Orders         Ordered    methocarbamol (ROBAXIN) 500 MG tablet  2 times daily     12/12/18 0955    naproxen (NAPROSYN) 500 MG tablet  2 times daily     12/12/18 0955    predniSONE (DELTASONE) 10 MG tablet  Daily     12/12/18 0955             Deliah Boston, PA-C 12/12/18 1001    Blanchie Dessert, MD 12/12/18 1305

## 2018-12-12 NOTE — ED Notes (Signed)
ED Provider at bedside.
